# Patient Record
Sex: Female | Born: 1945 | Race: White | Hispanic: No | Marital: Married | Smoking: Never smoker
Health system: Southern US, Community
[De-identification: ages and names within clinical notes are randomized; demographics above are authoritative.]

## PROBLEM LIST (undated history)

## (undated) DIAGNOSIS — E119 Type 2 diabetes mellitus without complications: Secondary | ICD-10-CM

## (undated) DIAGNOSIS — M1990 Unspecified osteoarthritis, unspecified site: Secondary | ICD-10-CM

## (undated) HISTORY — PX: ABDOMINAL HYSTERECTOMY: SHX81

## (undated) HISTORY — PX: CHOLECYSTECTOMY: SHX55

---

## 2008-07-08 ENCOUNTER — Emergency Department (HOSPITAL_COMMUNITY): Admission: EM | Admit: 2008-07-08 | Discharge: 2008-07-08 | Payer: Self-pay | Admitting: Emergency Medicine

## 2010-10-08 LAB — BASIC METABOLIC PANEL
BUN: 23 mg/dL (ref 6–23)
Chloride: 100 mEq/L (ref 96–112)
Creatinine, Ser: 0.95 mg/dL (ref 0.4–1.2)
GFR calc non Af Amer: >60 mL/min (ref >60)

## 2010-10-08 LAB — GLUCOSE, CAPILLARY: Glucose-Capillary: 141 mg/dL — ABNORMAL HIGH (ref 70–99)

## 2013-05-12 DIAGNOSIS — H2510 Age-related nuclear cataract, unspecified eye: Secondary | ICD-10-CM | POA: Insufficient documentation

## 2013-07-30 DIAGNOSIS — Z947 Corneal transplant status: Secondary | ICD-10-CM | POA: Insufficient documentation

## 2013-07-30 DIAGNOSIS — Z961 Presence of intraocular lens: Secondary | ICD-10-CM | POA: Insufficient documentation

## 2014-02-25 DIAGNOSIS — R768 Other specified abnormal immunological findings in serum: Secondary | ICD-10-CM | POA: Insufficient documentation

## 2014-02-25 DIAGNOSIS — M1990 Unspecified osteoarthritis, unspecified site: Secondary | ICD-10-CM | POA: Insufficient documentation

## 2014-02-25 DIAGNOSIS — M064 Inflammatory polyarthropathy: Secondary | ICD-10-CM | POA: Insufficient documentation

## 2014-02-25 DIAGNOSIS — E6609 Other obesity due to excess calories: Secondary | ICD-10-CM | POA: Insufficient documentation

## 2014-03-14 DIAGNOSIS — L932 Other local lupus erythematosus: Secondary | ICD-10-CM | POA: Insufficient documentation

## 2014-03-14 DIAGNOSIS — T50905A Adverse effect of unspecified drugs, medicaments and biological substances, initial encounter: Secondary | ICD-10-CM | POA: Insufficient documentation

## 2014-09-19 DIAGNOSIS — M25561 Pain in right knee: Secondary | ICD-10-CM | POA: Insufficient documentation

## 2014-10-27 ENCOUNTER — Ambulatory Visit: Payer: Self-pay | Admitting: Orthopedic Surgery

## 2014-10-31 ENCOUNTER — Ambulatory Visit (INDEPENDENT_AMBULATORY_CARE_PROVIDER_SITE_OTHER): Payer: Medicare HMO | Admitting: Orthopedic Surgery

## 2014-10-31 ENCOUNTER — Ambulatory Visit (INDEPENDENT_AMBULATORY_CARE_PROVIDER_SITE_OTHER): Payer: Medicare HMO

## 2014-10-31 VITALS — BP 151/78 | Ht 64.0 in | Wt 314.0 lb

## 2014-10-31 DIAGNOSIS — M1711 Unilateral primary osteoarthritis, right knee: Secondary | ICD-10-CM

## 2014-10-31 DIAGNOSIS — M25561 Pain in right knee: Secondary | ICD-10-CM

## 2014-10-31 MED ORDER — TRAMADOL-ACETAMINOPHEN 37.5-325 MG PO TABS: 1.0000 | ORAL_TABLET | ORAL | Status: DC | PRN

## 2014-10-31 NOTE — Patient Instructions (Signed)
Total Knee Replacement °Total knee replacement is a procedure to replace your knee joint with an artificial knee joint (prosthetic knee joint). The purpose of this surgery is to reduce pain and improve your knee function. °LET YOUR HEALTH CARE PROVIDER KNOW ABOUT:  °· Any allergies you have. °· All medicines you are taking, including vitamins, herbs, eye drops, creams, and over-the-counter medicines. °· Previous problems you or members of your family have had with the use of anesthetics. °· Any blood disorders you have. °· Previous surgeries you have had. °· Medical conditions you have. °RISKS AND COMPLICATIONS  °Generally, total knee replacement is a safe procedure. However, problems can occur and include: °· Loss of range of motion of the knee or instability. °· Loosening of the prosthesis. °· Infection. °· Persistent pain. °BEFORE THE PROCEDURE  °· Do not eat or drink anything after midnight on the night before the procedure or as directed by your health care provider. °· Ask your health care provider about changing or stopping your regular medicines. This is especially important if you are taking diabetes medicines or blood thinners. °PROCEDURE  °· Just before the procedure, you will receive medicine that will make you drowsy (sedative). This will be given through a tube that is inserted into one of your veins (IV tube). °· Then you will be given one of the following: °¨ A medicine injected into your spine that numbs your body below the waist (spinal anesthetic). °¨ A medicine that makes you fall asleep (general anesthetic). °¨ You may also receive medicine to block feeling in your leg (nerve block) to help ease pain after surgery. °· An incision will be made in your knee. Your surgeon will take out any damaged cartilage and bone by sawing off the damaged surfaces. °· The surgeon will then put a new metal liner over the sawed-off portion of your thigh bone (femur) and a plastic liner over the sawed-off portion  of one of the bones of your lower leg (tibia). This is to restore alignment and function to your knee. A plastic piece is often used to restore the surface of your knee cap. °AFTER THE PROCEDURE  °· You will be taken to the recovery area. °· You may have drainage tubes to drain excess fluid from your knee. These tubes attach to a device that removes these fluids. °· Once you are awake, stable, and taking fluids well, you will be taken to your hospital room. °· You will receive physical therapy as prescribed by your health care provider. °· Your surgeon may recommend that you spend time (usually an additional 10-14 days) in an extended-care facility to help you begin walking again and improve your range of motion before you go home. °· You may also be prescribed blood-thinning medicine to decrease your risk of developing blood clots in your leg. °Document Released: 09/16/2000 Document Revised: 10/25/2013 Document Reviewed: 07/21/2011 °Exit[employer]® Patient Information ©2015 [employer]. This information is not intended to replace advice given to you by your health care provider. Make sure you discuss any questions you have with your health care provider. ° °

## 2014-10-31 NOTE — Progress Notes (Signed)
Patient ID: Katelyn Pope, female   DOB: 1945-10-25, 69 y.o.   MRN: [id number]  Chief Complaint  Patient presents with  . Knee Pain    right knee pain s/p fall, ref [facility]     Katelyn Pope is a 69 y.o. female.   HPI This is a 69 year old female who had some ongoing knee pain bilaterally but her situation change in January of this year when she fell and her symptoms got worse. She complains of throbbing burning stabbing pain medial joint line associated was swelling stiffness and giving way. She rates her pain 8 out of 10. She was seen in [place] and she got 2 injections and she is on meloxicam. She was told she was too heavy for knee replacement. Today her BMI is 53 although she lost 53 pounds last year  She has no allergies  Family history diabetes heart disease and cancer social history she does not smoke or drink she is currently retired from several middle jobs  Review of systems negative except for joint pain  Medical history diabetes hypertension arthritis  Surgery 1997 hysterectomy 2001 right carpal tunnel release and in 1998 had a cholecystectomy  Current medications are furosemide 40 moxifloxacin cam 7.5 omeprazole 20 Zyrtec 10 lisinopril 12.5 metformin 500 NovoLog 12 and Levemir 30 and she takes a statin drug as well. Review of Systems See above  No past medical history on file.  No past surgical history on file.  No family history on file.  Social History History  Substance Use Topics  . Smoking status: Not on file  . Smokeless tobacco: Not on file  . Alcohol Use: Not on file    No Known Allergies  Current Outpatient Prescriptions  Medication Sig Dispense Refill  . atorvastatin (LIPITOR) 10 MG tablet Take 10 mg by mouth daily.    . cetirizine (ZYRTEC) 10 MG tablet Take 10 mg by mouth daily.    . furosemide (LASIX) 40 MG tablet Take 40 mg by mouth.    . insulin aspart (NOVOLOG) 100 UNIT/ML injection Inject 12 Units into the skin.    Marland Kitchen. insulin  detemir (LEVEMIR) 100 UNIT/ML injection Inject 30 Units into the skin.    Marland Kitchen. lisinopril (PRINIVIL,ZESTRIL) 2.5 MG tablet Take 2.5 mg by mouth daily.    . meloxicam (MOBIC) 7.5 MG tablet Take 7.5 mg by mouth daily.    . metFORMIN (GLUCOPHAGE) 500 MG tablet Take by mouth 2 (two) times daily with a meal.    . omeprazole (PRILOSEC) 20 MG capsule Take 20 mg by mouth daily.    . traMADol-acetaminophen (ULTRACET) 37.5-325 MG per tablet Take 1 tablet by mouth every 4 (four) hours as needed. 90 tablet 5   No current facility-administered medications for this visit.       Physical Exam Blood pressure 151/78, height 5\' 4"  (1.626 m), weight 314 lb (142.429 kg). Physical Exam The patient is well developed well nourished and well groomed. Orientation to person place and time is normal  Mood is pleasant. Ambulatory status she is ambulatory now with no assistive devices she is limping favoring the right lower extremity. Her upper extremities checked out normal in terms of ability to use a walker in that she had no malalignment contracture subluxation atrophy tremor or weakness  Skin was intact and neurovascular exam is normal  She has medial joint line tenderness in both knees. Her range of motion is limited to 100. Both knees remain stable. Motor exam is normal. Her skin is  discolored from peripheral venous stasis disease. She has peripheral edema pitting. She has a 2+ dorsalis pedis pulse. And she has normal sensation in both feet.    Data Reviewed Our x-rays today show that she has severe arthritis on the medial compartment with greater than 50% loss of joint space she has secondary bone changes in the patellofemoral area and proximal tibia on the medial side  The records from the primary care physician R reviewed. Her primary care physician has documented basically what she is told me.  Assessment Osteoarthritis both knees are currently right worse than left with a BMI of 53. Although she did  lose 35 pounds last week we need to be even more aggressive to get her BMI to 40 for her chances of complication would go way down especially in the setting of diabetes. We know that a BMI greater than 40 shown to have significant complications with the wound healing and decreased long-term favorable results.   Plan She should continue her meloxicam she can take Ultracet for pain and wouldn't go anything higher than Tylenol 3 for pain however.  She will follow-up with us and I will send a letter to the primary care physician hopefully she can get some medication to help get her weight down. Follow-up 6 months.

## 2014-11-04 ENCOUNTER — Telehealth: Payer: Self-pay | Admitting: Orthopedic Surgery

## 2014-11-04 NOTE — Telephone Encounter (Signed)
Patient stopped by the office today stating that the medication she was given in the office on 10/31/14 traMADol-acetaminophen (ULTRACET) 37.5-325 MG per tablet is not helping at all, please advise?

## 2014-11-07 ENCOUNTER — Other Ambulatory Visit: Payer: Self-pay | Admitting: *Deleted

## 2014-11-07 ENCOUNTER — Ambulatory Visit: Payer: Self-pay | Admitting: Orthopedic Surgery

## 2014-11-07 MED ORDER — ACETAMINOPHEN-CODEINE #3 300-30 MG PO TABS: 1.0000 | ORAL_TABLET | ORAL | Status: DC | PRN

## 2014-11-07 NOTE — Telephone Encounter (Signed)
Per dr Romeo Appleharrison change to tylenol 3, patient aware, faxed to [facility]

## 2014-11-07 NOTE — Telephone Encounter (Signed)
Routing to Dr Harrison 

## 2014-11-07 NOTE — Telephone Encounter (Signed)
Plan She should continue her meloxicam she can take Ultracet for pain and wouldn't go anything higher than Tylenol 3 for pain however.  She will follow-up with us and I will send a letter to the primary care physician hopefully she can get some medication to help get her weight down. Follow-up 6 months.

## 2014-11-29 DIAGNOSIS — H1820 Unspecified corneal edema: Secondary | ICD-10-CM | POA: Insufficient documentation

## 2014-11-29 DIAGNOSIS — H25811 Combined forms of age-related cataract, right eye: Secondary | ICD-10-CM | POA: Insufficient documentation

## 2014-12-05 ENCOUNTER — Other Ambulatory Visit: Payer: Self-pay | Admitting: Orthopedic Surgery

## 2014-12-05 ENCOUNTER — Telehealth: Payer: Self-pay | Admitting: Orthopedic Surgery

## 2014-12-05 ENCOUNTER — Other Ambulatory Visit: Payer: Self-pay | Admitting: *Deleted

## 2014-12-05 MED ORDER — ACETAMINOPHEN-CODEINE #3 300-30 MG PO TABS: 1.0000 | ORAL_TABLET | ORAL | Status: DC | PRN

## 2014-12-05 MED ORDER — ACETAMINOPHEN-CODEINE #3 300-30 MG PO TABS: 1.0000 | ORAL_TABLET | Freq: Three times a day (TID) | ORAL | Status: DC | PRN

## 2014-12-05 NOTE — Telephone Encounter (Signed)
Prescription available, patient aware  

## 2014-12-05 NOTE — Telephone Encounter (Signed)
Patient is requesting a refill on acetaminophen-codeine (TYLENOL #3) 300-30 MG per tablet please advise?

## 2014-12-08 NOTE — Telephone Encounter (Signed)
Patient picked up Rx

## 2015-04-27 ENCOUNTER — Other Ambulatory Visit: Payer: Self-pay | Admitting: *Deleted

## 2015-04-27 DIAGNOSIS — M25561 Pain in right knee: Secondary | ICD-10-CM

## 2015-04-27 MED ORDER — TRAMADOL-ACETAMINOPHEN 37.5-325 MG PO TABS: 1.0000 | ORAL_TABLET | ORAL | Status: DC | PRN

## 2015-05-04 ENCOUNTER — Ambulatory Visit (INDEPENDENT_AMBULATORY_CARE_PROVIDER_SITE_OTHER): Payer: Medicare HMO | Admitting: Orthopedic Surgery

## 2015-05-04 VITALS — BP 162/84 | Ht 64.0 in | Wt 312.0 lb

## 2015-05-04 DIAGNOSIS — M1711 Unilateral primary osteoarthritis, right knee: Secondary | ICD-10-CM | POA: Diagnosis not present

## 2015-05-04 MED ORDER — ACETAMINOPHEN-CODEINE #3 300-30 MG PO TABS: 1.0000 | ORAL_TABLET | Freq: Three times a day (TID) | ORAL | Status: DC | PRN

## 2015-05-04 NOTE — Patient Instructions (Signed)
Continue to work on weight loss 

## 2015-05-04 NOTE — Progress Notes (Signed)
Chief Complaint  Patient presents with  . Follow-up    follow up right knee, discuss TKA    Diagnosis osteoarthritis right knee  69 year old female presents or presented with bilateral knee pain status post multiple injections, treatment with meloxicam no improvement. She was told she was too heavy for knee replacement I agreed acid of the some weight she lost 2 pounds we put her on Tylenol No. 3 and she got good pain relief  Review of systems no new findings  BP 162/84 mmHg  Ht 5\' 4"  (1.626 m)  Wt 312 lb (141.522 kg)  BMI 53.53 kg/m2 She's walking no support no limp today. Knee flexion is limited at 100. No tenderness or swelling knee stable motor exam intact skin shows pigmented changes from chronic vascular stasis she has some pitting edema as well in both legs  Impression osteoarthritis primarily right knee and left knee  Continue Tylenol No. 3 follow-up 6 months continue weight loss.     Meds ordered this encounter  Medications  . acetaminophen-codeine (TYLENOL #3) 300-30 MG tablet    Sig: Take 1 tablet by mouth every 8 (eight) hours as needed for moderate pain.    Dispense:  90 tablet    Refill:  5

## 2015-05-04 NOTE — Progress Notes (Signed)
Body mass index is 53.53 kg/(m^2). BP 162/84 mmHg  Ht 5\' 4"  (1.626 m)  Wt 312 lb (141.522 kg)  BMI 53.53 kg/m2

## 2015-06-01 ENCOUNTER — Telehealth: Payer: Self-pay | Admitting: Orthopedic Surgery

## 2015-06-01 NOTE — Telephone Encounter (Signed)
Patient called to relay that she was told by Dr Romeo AppleHarrison that she can call back to request stronger medication if needed for her right knee; had been prescribed: traMADol-acetaminophen (ULTRACET) 37.5-325 MG tablet [[id number]][[id number]] - please advise. Ph# [phone]

## 2015-06-01 NOTE — Telephone Encounter (Signed)
Tylenol 3 is as strong as she can have

## 2015-06-01 NOTE — Telephone Encounter (Signed)
Patient aware, says she already has refills on that medication

## 2015-06-01 NOTE — Telephone Encounter (Signed)
Routing to Dr Harrison for review 

## 2015-11-02 ENCOUNTER — Ambulatory Visit: Payer: Medicare HMO | Admitting: Orthopedic Surgery

## 2015-11-15 ENCOUNTER — Encounter: Payer: Self-pay | Admitting: Orthopedic Surgery

## 2015-11-15 ENCOUNTER — Ambulatory Visit: Payer: Medicare HMO | Admitting: Orthopedic Surgery

## 2015-11-27 ENCOUNTER — Other Ambulatory Visit: Payer: Self-pay | Admitting: *Deleted

## 2015-11-27 DIAGNOSIS — M25561 Pain in right knee: Secondary | ICD-10-CM

## 2015-11-27 MED ORDER — TRAMADOL-ACETAMINOPHEN 37.5-325 MG PO TABS: 1.0000 | ORAL_TABLET | ORAL | Status: DC | PRN

## 2016-03-21 DIAGNOSIS — G8929 Other chronic pain: Secondary | ICD-10-CM | POA: Insufficient documentation

## 2016-03-21 DIAGNOSIS — E1140 Type 2 diabetes mellitus with diabetic neuropathy, unspecified: Secondary | ICD-10-CM | POA: Insufficient documentation

## 2016-03-21 DIAGNOSIS — IMO0002 Reserved for concepts with insufficient information to code with codable children: Secondary | ICD-10-CM | POA: Insufficient documentation

## 2016-03-21 DIAGNOSIS — K219 Gastro-esophageal reflux disease without esophagitis: Secondary | ICD-10-CM | POA: Insufficient documentation

## 2016-06-26 ENCOUNTER — Other Ambulatory Visit: Payer: Self-pay | Admitting: Orthopedic Surgery

## 2016-06-26 DIAGNOSIS — M25561 Pain in right knee: Secondary | ICD-10-CM

## 2016-08-24 ENCOUNTER — Other Ambulatory Visit: Payer: Self-pay | Admitting: Orthopedic Surgery

## 2016-08-24 DIAGNOSIS — M25561 Pain in right knee: Secondary | ICD-10-CM

## 2016-09-30 ENCOUNTER — Encounter: Payer: Self-pay | Admitting: Orthopedic Surgery

## 2016-09-30 ENCOUNTER — Ambulatory Visit: Payer: Medicare HMO | Admitting: Orthopedic Surgery

## 2017-06-09 ENCOUNTER — Other Ambulatory Visit: Payer: Self-pay | Admitting: "Endocrinology

## 2017-08-06 ENCOUNTER — Encounter (HOSPITAL_COMMUNITY): Payer: Self-pay

## 2017-08-06 ENCOUNTER — Emergency Department (HOSPITAL_COMMUNITY): Payer: Medicare HMO

## 2017-08-06 ENCOUNTER — Other Ambulatory Visit: Payer: Self-pay

## 2017-08-06 ENCOUNTER — Emergency Department (HOSPITAL_COMMUNITY)
Admission: EM | Admit: 2017-08-06 | Discharge: 2017-08-06 | Disposition: A | Discharge status: Home / Self Care | Payer: Medicare HMO | Attending: Emergency Medicine | Admitting: Emergency Medicine

## 2017-08-06 DIAGNOSIS — S299XXA Unspecified injury of thorax, initial encounter: Secondary | ICD-10-CM | POA: Diagnosis present

## 2017-08-06 DIAGNOSIS — E1165 Type 2 diabetes mellitus with hyperglycemia: Secondary | ICD-10-CM | POA: Insufficient documentation

## 2017-08-06 DIAGNOSIS — Y998 Other external cause status: Secondary | ICD-10-CM | POA: Insufficient documentation

## 2017-08-06 DIAGNOSIS — S8012XA Contusion of left lower leg, initial encounter: Secondary | ICD-10-CM | POA: Insufficient documentation

## 2017-08-06 DIAGNOSIS — Z79899 Other long term (current) drug therapy: Secondary | ICD-10-CM | POA: Insufficient documentation

## 2017-08-06 DIAGNOSIS — Z9049 Acquired absence of other specified parts of digestive tract: Secondary | ICD-10-CM | POA: Insufficient documentation

## 2017-08-06 DIAGNOSIS — Y92410 Unspecified street and highway as the place of occurrence of the external cause: Secondary | ICD-10-CM | POA: Insufficient documentation

## 2017-08-06 DIAGNOSIS — S1093XA Contusion of unspecified part of neck, initial encounter: Secondary | ICD-10-CM | POA: Diagnosis not present

## 2017-08-06 DIAGNOSIS — T07XXXA Unspecified multiple injuries, initial encounter: Secondary | ICD-10-CM

## 2017-08-06 DIAGNOSIS — S301XXA Contusion of abdominal wall, initial encounter: Secondary | ICD-10-CM | POA: Diagnosis not present

## 2017-08-06 DIAGNOSIS — S8001XA Contusion of right knee, initial encounter: Secondary | ICD-10-CM | POA: Diagnosis not present

## 2017-08-06 DIAGNOSIS — S20211A Contusion of right front wall of thorax, initial encounter: Secondary | ICD-10-CM | POA: Insufficient documentation

## 2017-08-06 DIAGNOSIS — Y9389 Activity, other specified: Secondary | ICD-10-CM | POA: Insufficient documentation

## 2017-08-06 DIAGNOSIS — Z794 Long term (current) use of insulin: Secondary | ICD-10-CM | POA: Insufficient documentation

## 2017-08-06 HISTORY — DX: Type 2 diabetes mellitus without complications: E11.9

## 2017-08-06 HISTORY — DX: Unspecified osteoarthritis, unspecified site: M19.90

## 2017-08-06 LAB — URINALYSIS, ROUTINE W REFLEX MICROSCOPIC
Bacteria, UA: NONE SEEN
Bilirubin Urine: NEGATIVE
HGB URINE DIPSTICK: NEGATIVE
Ketones, ur: NEGATIVE mg/dL
Leukocytes, UA: NEGATIVE
Nitrite: NEGATIVE
Protein, ur: NEGATIVE mg/dL
SPECIFIC GRAVITY, URINE: 1.026 (ref 1.005–1.030)
pH: 5 (ref 5.0–8.0)

## 2017-08-06 LAB — COMPREHENSIVE METABOLIC PANEL
ALT: 46 U/L (ref 14–54)
AST: 59 U/L — AB (ref 15–41)
Albumin: 3.8 g/dL (ref 3.5–5.0)
Alkaline Phosphatase: 108 U/L (ref 38–126)
Anion gap: 10 (ref 5–15)
BUN: 19 mg/dL (ref 6–20)
CO2: 25 mmol/L (ref 22–32)
CREATININE: 1.09 mg/dL — AB (ref 0.44–1.00)
Calcium: 9.2 mg/dL (ref 8.9–10.3)
Chloride: 98 mmol/L — ABNORMAL LOW (ref 101–111)
GFR calc Af Amer: 59 mL/min — ABNORMAL LOW (ref >60)
GFR, EST NON AFRICAN AMERICAN: 51 mL/min — AB (ref >60)
Glucose, Bld: 430 mg/dL — ABNORMAL HIGH (ref 65–99)
POTASSIUM: 4 mmol/L (ref 3.5–5.1)
Sodium: 133 mmol/L — ABNORMAL LOW (ref 135–145)
TOTAL PROTEIN: 7 g/dL (ref 6.5–8.1)
Total Bilirubin: 0.8 mg/dL (ref 0.3–1.2)

## 2017-08-06 LAB — CBC WITH DIFFERENTIAL/PLATELET
BASOS ABS: 0 10*3/uL (ref 0.0–0.1)
Basophils Relative: 0 %
Eosinophils Absolute: 0.1 10*3/uL (ref 0.0–0.7)
Eosinophils Relative: 1 %
HEMATOCRIT: 38.3 % (ref 36.0–46.0)
Hemoglobin: 12.6 g/dL (ref 12.0–15.0)
LYMPHS PCT: 18 %
Lymphs Abs: 1.7 10*3/uL (ref 0.7–4.0)
MCH: 30.6 pg (ref 26.0–34.0)
MCHC: 32.9 g/dL (ref 30.0–36.0)
MCV: 93 fL (ref 78.0–100.0)
MONO ABS: 0.6 10*3/uL (ref 0.1–1.0)
Monocytes Relative: 6 %
Neutro Abs: 6.8 10*3/uL (ref 1.7–7.7)
Neutrophils Relative %: 75 %
Platelets: 203 10*3/uL (ref 150–400)
RBC: 4.12 MIL/uL (ref 3.87–5.11)
RDW: 12.8 % (ref 11.5–15.5)
WBC: 9.2 10*3/uL (ref 4.0–10.5)

## 2017-08-06 MED ORDER — FENTANYL CITRATE (PF) 100 MCG/2ML IJ SOLN
100.0000 ug | Freq: Once | INTRAMUSCULAR | Status: AC
  Administered 2017-08-06: 100 ug via INTRAVENOUS
  Filled 2017-08-06: qty 2

## 2017-08-06 NOTE — Discharge Instructions (Signed)
Tylenol every 4 hours as needed for pain.  Blood sugar today was 430.  Take your diabetic medication as soon as you arrive home tonight.  Call Dr. Joyce CopaNyland's office tomorrow to arrange to get seen this week.  We feel that it is important that you get reevaluated by Dr. Lysbeth GalasNyland in light of the fact that you refused CT scans tonight.  Return if you feel worse for any reason
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
## 2017-08-06 NOTE — ED Notes (Signed)
Patient transported to CT 
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
## 2017-09-17 DIAGNOSIS — E1159 Type 2 diabetes mellitus with other circulatory complications: Secondary | ICD-10-CM | POA: Insufficient documentation

## 2017-11-10 ENCOUNTER — Ambulatory Visit: Payer: Medicare HMO | Admitting: Orthopedic Surgery

## 2017-11-10 VITALS — BP 160/98 | HR 86 | Ht 64.0 in | Wt 287.0 lb

## 2017-11-10 DIAGNOSIS — M170 Bilateral primary osteoarthritis of knee: Secondary | ICD-10-CM

## 2017-11-10 MED ORDER — TRAMADOL-ACETAMINOPHEN 37.5-325 MG PO TABS: 1.0000 | ORAL_TABLET | ORAL | 0 refills | Status: DC | PRN

## 2017-11-10 NOTE — Progress Notes (Signed)
Progress Note//NEW PROBLEM  Patient ID: Katelyn Pope, female   DOB: Oct 09, 1945, 72 y.o.   MRN: [id number] Chief Complaint  Patient presents with  . Knee Pain    Knee pain, no injury.        Medical decision-making  Imaging:  Xrays ordered: y/n ?  Right knee x-ray  Encounter Diagnosis  Name Primary?  . Primary osteoarthritis of both knees Yes     PLAN:  At this point we will resume Ultracet for her as that worked in the past she needs a left knee x-ray on the next visit in 6 months she needs to work on her weight to get her BMI down to 40 and she needs her A1c down to under 7 Meds ordered this encounter  Medications  . traMADol-acetaminophen (ULTRACET) 37.5-325 MG tablet    Sig: Take 1 tablet by mouth every 4 (four) hours as needed.    Dispense:  42 tablet    Refill:  0      Chief Complaint  Patient presents with  . Knee Pain    Knee pain, no injury.    72 year old female followed by [facility] orthopedics for bilateral knee pain status post injections currently using salon pause pain patches and meloxicam for bilateral knee pain consistent with osteoarthritis.  She complains of dull aching bilateral knee pain primarily along the medial joint lines without any history of significant trauma.  Her symptoms seem to be associated with increased activity mild swelling stiffness and decreased range of motion  The meloxicam and injections she says did not help her pain at all    Review of Systems  Respiratory: Negative.   Cardiovascular: Negative.   Musculoskeletal: Positive for back pain.   Current Meds  Medication Sig  . ALPRAZolam (XANAX) 0.5 MG tablet Take 1 mg by mouth 3 (three) times daily.  Marland Kitchen atorvastatin (LIPITOR) 10 MG tablet Take 10 mg by mouth daily.  . cetirizine (ZYRTEC) 10 MG tablet Take 10 mg by mouth daily.  . furosemide (LASIX) 40 MG tablet Take 20 mg by mouth daily.   . insulin detemir (LEVEMIR) 100 UNIT/ML injection Inject 30 Units into the  skin every morning.   Marland Kitchen lisinopril (PRINIVIL,ZESTRIL) 2.5 MG tablet Take 2.5 mg by mouth daily.  . meloxicam (MOBIC) 7.5 MG tablet Take 7.5 mg by mouth daily.  . metFORMIN (GLUCOPHAGE) 500 MG tablet Take 500 mg by mouth at bedtime.   Marland Kitchen omeprazole (PRILOSEC) 20 MG capsule Take 20 mg by mouth daily.    Past Medical History:  Diagnosis Date  . Arthritis   . Diabetes mellitus without complication (HCC)      No Known Allergies  BP (!) 160/98   Pulse 86   Ht  (1.626 m)   Wt 287 lb (130.2 kg)   BMI 49.26 kg/m    Physical Exam  Constitutional: She is oriented to person, place, and time. She appears well-developed and well-nourished.  Moderate to severely overweight  Neurological: She is alert and oriented to person, place, and time.  Psychiatric: She has a normal mood and affect. Judgment normal.  Vitals reviewed.   Right Knee Exam   Muscle Strength  The patient has normal right knee strength.  Tenderness  The patient is experiencing tenderness in the medial joint line and pes anserinus.  Range of Motion  Extension: normal  Flexion:  120 normal   Tests  McMurray:  Medial - negative Lateral - negative Varus: negative Valgus: negative Drawer:  Anterior -  negative    Posterior - negative  Other  Erythema: absent Scars: absent Sensation: normal Pulse: present Swelling: none   Left Knee Exam   Muscle Strength  The patient has normal left knee strength.  Tenderness  The patient is experiencing tenderness in the medial joint line and pes anserinus.  Range of Motion  Extension: normal  Flexion:  120 normal   Tests  McMurray:  Medial - negative Lateral - negative Varus: negative Valgus: negative Drawer:  Anterior - negative     Posterior - negative  Other  Erythema: absent Scars: absent Sensation: normal Pulse: present Swelling: none     Fuller Canada, MD  12:24 PM 11/10/2017

## 2017-11-10 NOTE — Patient Instructions (Signed)
Weight loss   Start ultracet

## 2017-11-21 ENCOUNTER — Other Ambulatory Visit: Payer: Self-pay | Admitting: Orthopedic Surgery

## 2017-11-21 DIAGNOSIS — M170 Bilateral primary osteoarthritis of knee: Secondary | ICD-10-CM

## 2017-12-01 DIAGNOSIS — E113299 Type 2 diabetes mellitus with mild nonproliferative diabetic retinopathy without macular edema, unspecified eye: Secondary | ICD-10-CM | POA: Insufficient documentation

## 2017-12-01 DIAGNOSIS — H33002 Unspecified retinal detachment with retinal break, left eye: Secondary | ICD-10-CM | POA: Insufficient documentation

## 2017-12-01 DIAGNOSIS — H2511 Age-related nuclear cataract, right eye: Secondary | ICD-10-CM | POA: Insufficient documentation

## 2017-12-17 DIAGNOSIS — H33052 Total retinal detachment, left eye: Secondary | ICD-10-CM | POA: Insufficient documentation

## 2018-01-20 ENCOUNTER — Other Ambulatory Visit: Payer: Self-pay | Admitting: Orthopedic Surgery

## 2018-01-20 DIAGNOSIS — M170 Bilateral primary osteoarthritis of knee: Secondary | ICD-10-CM

## 2018-02-25 ENCOUNTER — Other Ambulatory Visit: Payer: Self-pay | Admitting: Orthopaedic Surgery

## 2018-02-25 DIAGNOSIS — M170 Bilateral primary osteoarthritis of knee: Secondary | ICD-10-CM

## 2018-04-06 ENCOUNTER — Other Ambulatory Visit: Payer: Self-pay | Admitting: Orthopedic Surgery

## 2018-04-06 DIAGNOSIS — M170 Bilateral primary osteoarthritis of knee: Secondary | ICD-10-CM

## 2018-05-06 ENCOUNTER — Other Ambulatory Visit: Payer: Self-pay | Admitting: Orthopedic Surgery

## 2018-05-06 DIAGNOSIS — M170 Bilateral primary osteoarthritis of knee: Secondary | ICD-10-CM

## 2018-05-13 ENCOUNTER — Encounter: Payer: Self-pay | Admitting: Orthopedic Surgery

## 2018-05-13 ENCOUNTER — Ambulatory Visit: Payer: 59 | Admitting: Orthopedic Surgery

## 2018-05-13 ENCOUNTER — Ambulatory Visit (INDEPENDENT_AMBULATORY_CARE_PROVIDER_SITE_OTHER): Payer: 59

## 2018-05-13 VITALS — BP 186/83 | HR 97 | Ht 64.0 in | Wt 286.0 lb

## 2018-05-13 DIAGNOSIS — M170 Bilateral primary osteoarthritis of knee: Secondary | ICD-10-CM

## 2018-05-13 DIAGNOSIS — G8929 Other chronic pain: Secondary | ICD-10-CM | POA: Diagnosis not present

## 2018-05-13 DIAGNOSIS — M25562 Pain in left knee: Secondary | ICD-10-CM

## 2018-05-13 DIAGNOSIS — Z6841 Body Mass Index (BMI) 40.0 and over, adult: Secondary | ICD-10-CM | POA: Diagnosis not present

## 2018-05-13 NOTE — Progress Notes (Addendum)
Progress Note   Patient ID: Katelyn StainBonnie J Weaver, female   DOB: 1945-09-06, 72 y.o.   MRN: [id number]   Chief Complaint  Patient presents with  . Knee Pain    Left knee getting worse    HPI The patient presents for evaluation of painful left knee  Location medial joint line Duration greater than a year Quality dull ache Severity moderate Associated with decreased range of motion currently on Ultracet and meloxicam  Review of Systems  Constitutional: Negative for fever.  Respiratory: Negative for shortness of breath.   Cardiovascular: Negative for chest pain.  Musculoskeletal: Positive for joint pain.  Neurological: Negative for tingling.   Current Meds  Medication Sig  . ALPRAZolam (XANAX) 0.5 MG tablet Take 1 mg by mouth 3 (three) times daily.  Marland Kitchen. atorvastatin (LIPITOR) 10 MG tablet Take 10 mg by mouth daily.  . cetirizine (ZYRTEC) 10 MG tablet Take 10 mg by mouth daily.  . furosemide (LASIX) 40 MG tablet Take 20 mg by mouth daily.   . insulin detemir (LEVEMIR) 100 UNIT/ML injection Inject 30 Units into the skin every morning.   Marland Kitchen. lisinopril (PRINIVIL,ZESTRIL) 2.5 MG tablet Take 2.5 mg by mouth daily.  . meloxicam (MOBIC) 7.5 MG tablet Take 7.5 mg by mouth daily.  . metFORMIN (GLUCOPHAGE) 500 MG tablet Take 500 mg by mouth at bedtime.   Marland Kitchen. omeprazole (PRILOSEC) 20 MG capsule Take 20 mg by mouth daily.  . traMADol-acetaminophen (ULTRACET) 37.5-325 MG tablet TAKE (1) TABLET EVERY FOUR HOURS AS NEEDED.    Past Medical History:  Diagnosis Date  . Arthritis   . Diabetes mellitus without complication (HCC)      No Known Allergies   BP (!) 186/83   Pulse 97   Ht 5\' 4"  (1.626 m)   Wt 286 lb (129.7 kg)   BMI 49.09 kg/m    Physical Exam General appearance normal Oriented x3 normal Mood pleasant affect normal Gait labored   Ortho Exam Left knee  She is noted to have a small flexion contracture her knee flexion arc is 110 degrees knee is stable to muscle tone is  normal she has tenderness in the medial compartment and the pes bursa Skin was warm dry and intact Good pulse and temperature were noted in the extremity Sensation revealed no abnormalities to light touch     MEDICAL DECISION MAKING   Imaging:  3 views left knee  5 degrees varus severe arthritis with joint space narrowing bone-on-bone contact severe secondary bone changes medial compartment   Encounter Diagnoses  Name Primary?  . Chronic pain of left knee   . Primary osteoarthritis of both knees Yes  . Morbid obesity (HCC)   . Body mass index 45.0-49.9, adult (HCC)      PLAN: (RX., injection, surgery,frx,mri/ct, XR 2 body ares) She did not want injections today she was advised to increase her Ultracet from 1 a day to 1 every 4-6 hours for pain which she was happy with she was advised to lose weight as well  The patient meets the AMA guidelines for Morbid (severe) obesity with a BMI > 40.0 and I have recommended weight loss.   No orders of the defined types were placed in this encounter.  11:43 AM 05/13/2018

## 2018-05-13 NOTE — Patient Instructions (Signed)
-   Weight loss 

## 2018-05-25 ENCOUNTER — Other Ambulatory Visit: Payer: Self-pay | Admitting: Orthopedic Surgery

## 2018-05-25 DIAGNOSIS — M170 Bilateral primary osteoarthritis of knee: Secondary | ICD-10-CM

## 2018-06-22 ENCOUNTER — Other Ambulatory Visit: Payer: Self-pay | Admitting: Orthopedic Surgery

## 2018-06-22 DIAGNOSIS — M170 Bilateral primary osteoarthritis of knee: Secondary | ICD-10-CM

## 2018-07-21 DIAGNOSIS — N183 Chronic kidney disease, stage 3 unspecified: Secondary | ICD-10-CM | POA: Insufficient documentation

## 2018-07-27 ENCOUNTER — Other Ambulatory Visit: Payer: Self-pay | Admitting: Orthopaedic Surgery

## 2018-07-27 DIAGNOSIS — M170 Bilateral primary osteoarthritis of knee: Secondary | ICD-10-CM

## 2018-08-24 ENCOUNTER — Other Ambulatory Visit: Payer: Self-pay | Admitting: Orthopaedic Surgery

## 2018-08-24 DIAGNOSIS — M170 Bilateral primary osteoarthritis of knee: Secondary | ICD-10-CM

## 2018-08-25 NOTE — Telephone Encounter (Signed)
(  Dr Harrison's patient) °

## 2018-09-24 ENCOUNTER — Other Ambulatory Visit: Payer: Self-pay | Admitting: Orthopedic Surgery

## 2018-09-24 DIAGNOSIS — M170 Bilateral primary osteoarthritis of knee: Secondary | ICD-10-CM

## 2018-11-11 ENCOUNTER — Encounter: Payer: Self-pay | Admitting: Orthopedic Surgery

## 2018-11-11 ENCOUNTER — Other Ambulatory Visit: Payer: Self-pay | Admitting: Orthopedic Surgery

## 2018-11-11 ENCOUNTER — Ambulatory Visit: Payer: Medicare HMO | Admitting: Orthopedic Surgery

## 2018-11-11 ENCOUNTER — Other Ambulatory Visit: Payer: Self-pay

## 2018-11-11 ENCOUNTER — Ambulatory Visit (INDEPENDENT_AMBULATORY_CARE_PROVIDER_SITE_OTHER): Payer: Medicare HMO

## 2018-11-11 VITALS — Temp 98.2°F | Resp 16 | Ht 64.0 in | Wt 281.0 lb

## 2018-11-11 DIAGNOSIS — M25562 Pain in left knee: Secondary | ICD-10-CM | POA: Diagnosis not present

## 2018-11-11 DIAGNOSIS — M170 Bilateral primary osteoarthritis of knee: Secondary | ICD-10-CM

## 2018-11-11 DIAGNOSIS — G8929 Other chronic pain: Secondary | ICD-10-CM

## 2018-11-11 DIAGNOSIS — Z6841 Body Mass Index (BMI) 40.0 and over, adult: Secondary | ICD-10-CM

## 2018-11-11 MED ORDER — TRAMADOL-ACETAMINOPHEN 37.5-325 MG PO TABS: 1.0000 | ORAL_TABLET | Freq: Four times a day (QID) | ORAL | 1 refills | Status: DC | PRN

## 2018-11-11 NOTE — Progress Notes (Signed)
  Progress Note   Patient ID: Katelyn Pope, female   DOB: June 29, 1945, 73 y.o.   MRN: [id number]   Chief Complaint  Patient presents with  . Knee Pain    left    73 yo female with chronic left knee pain from OA presents for her 6 mo fu.  Katelyn Pope is complaining of bilateral knee pain left greater than right with good relief using 2 Ultracet per day      Review of Systems  Musculoskeletal: Positive for joint pain. Negative for falls.  Neurological: Negative for tingling and sensory change.     No Known Allergies   Temp 98.2 F (36.8 C)   Resp 16   Ht 5\' 4"  (1.626 m)   Wt 281 lb (127.5 kg)   BMI 48.23 kg/m   Physical Exam Vitals signs and nursing note reviewed.  Constitutional:      Appearance: Normal appearance.  Musculoskeletal:       Legs:  Neurological:     Mental Status: Katelyn Pope is alert and oriented to person, place, and time.  Psychiatric:        Mood and Affect: Mood normal.      Medical decisions:   Data  Imaging:   See dictated report, x-ray shows moderate varus deformity severe arthritis medial compartment left knee  Encounter Diagnoses  Name Primary?  . Chronic pain of left knee Yes  . Primary osteoarthritis of both knees   . Body mass index 45.0-49.9, adult (HCC)   . Morbid obesity (HCC)     PLAN:   Refill tramadol with enough to last a month X-ray both knees in 3 months Return in 3 months    Fuller Canada, MD 11/11/2018 11:25 AM

## 2018-11-11 NOTE — Patient Instructions (Addendum)
Try to keep your weight down  Arthritis Arthritis is a term that is commonly used to refer to joint pain or joint disease. There are more than 100 types of arthritis. What are the causes? The most common cause of this condition is wear and tear of a joint. Other causes include:  Gout.  Inflammation of a joint.  An infection of a joint.  Sprains and other injuries near the joint.  A drug reaction or allergic reaction. In some cases, the cause may not be known. What are the signs or symptoms? The main symptom of this condition is pain in the joint with movement. Other symptoms include:  Redness, swelling, or stiffness at a joint.  Warmth coming from the joint.  Fever.  Overall feeling of illness. How is this diagnosed? This condition may be diagnosed with a physical exam and tests, including:  Blood tests.  Urine tests.  Imaging tests, such as MRI, X-rays, or a CT scan. Sometimes, fluid is removed from a joint for testing. How is this treated? Treatment for this condition may involve:  Treatment of the cause, if it is known.  Rest.  Raising (elevating) the joint.  Applying cold or hot packs to the joint.  Medicines to improve symptoms and reduce inflammation.  Injections of a steroid such as cortisone into the joint to help reduce pain and inflammation. Depending on the cause of your arthritis, you may need to make lifestyle changes to reduce stress on your joint. These changes may include exercising more and losing weight. Follow these instructions at home: Medicines  Take over-the-counter and prescription medicines only as told by your health care provider.  Do not take aspirin to relieve pain if gout is suspected. Activity  Rest your joint if told by your health care provider. Rest is important when your disease is active and your joint feels painful, swollen, or stiff.  Avoid activities that make the pain worse. It is important to balance activity with  rest.  Exercise your joint regularly with range-of-motion exercises as told by your health care provider. Try doing low-impact exercise, such as: ? Swimming. ? Water aerobics. ? Biking. ? Walking. Joint Care   If your joint is swollen, keep it elevated if told by your health care provider.  If your joint feels stiff in the morning, try taking a warm shower.  If directed, apply heat to the joint. If you have diabetes, do not apply heat without permission from your health care provider. ? Put a towel between the joint and the hot pack or heating pad. ? Leave the heat on the area for 20-30 minutes.  If directed, apply ice to the joint: ? Put ice in a plastic bag. ? Place a towel between your skin and the bag. ? Leave the ice on for 20 minutes, 2-3 times per day.  Keep all follow-up visits as told by your health care provider. This is important. Contact a health care provider if:  The pain gets worse.  You have a fever. Get help right away if:  You develop severe joint pain, swelling, or redness.  Many joints become painful and swollen.  You develop severe back pain.  You develop severe weakness in your leg.  You cannot control your bladder or bowels. This information is not intended to replace advice given to you by your health care provider. Make sure you discuss any questions you have with your health care provider. Document Released: 07/18/2004 Document Revised: 11/16/2015 Document Reviewed: 09/05/2014  Chartered certified accountant Patient Education  [employer].

## 2019-01-06 ENCOUNTER — Other Ambulatory Visit: Payer: Self-pay | Admitting: Orthopedic Surgery

## 2019-01-06 DIAGNOSIS — M170 Bilateral primary osteoarthritis of knee: Secondary | ICD-10-CM

## 2019-02-24 ENCOUNTER — Other Ambulatory Visit: Payer: Self-pay

## 2019-02-24 ENCOUNTER — Ambulatory Visit: Payer: Medicare HMO

## 2019-02-24 ENCOUNTER — Other Ambulatory Visit: Payer: Self-pay | Admitting: Orthopedic Surgery

## 2019-02-24 ENCOUNTER — Ambulatory Visit (INDEPENDENT_AMBULATORY_CARE_PROVIDER_SITE_OTHER): Payer: Medicare HMO | Admitting: Orthopedic Surgery

## 2019-02-24 VITALS — BP 136/79 | HR 84 | Ht 64.0 in | Wt 279.0 lb

## 2019-02-24 DIAGNOSIS — M170 Bilateral primary osteoarthritis of knee: Secondary | ICD-10-CM

## 2019-02-24 DIAGNOSIS — M1710 Unilateral primary osteoarthritis, unspecified knee: Secondary | ICD-10-CM

## 2019-02-24 DIAGNOSIS — Z6841 Body Mass Index (BMI) 40.0 and over, adult: Secondary | ICD-10-CM | POA: Diagnosis not present

## 2019-02-24 MED ORDER — TRAMADOL-ACETAMINOPHEN 37.5-325 MG PO TABS: 1.0000 | ORAL_TABLET | Freq: Four times a day (QID) | ORAL | 0 refills | Status: DC | PRN

## 2019-02-24 NOTE — Progress Notes (Signed)
  Progress Note   Patient ID: Katelyn Pope, female   DOB: 30-May-1946, 73 y.o.   MRN: [id number]   Chief Complaint  Patient presents with  . Knee Pain    Recheck on bilateral knees.    Encounter Diagnoses  Name Primary?  Marland Kitchen Arthritis of knee Yes  . Body mass index 45.0-49.9, adult ([place])   . Morbid obesity ([place])     73 years old bilateral knee pain bilateral knee arthritis BMI 47 currently on Ultracet using a topical lidocaine cream with salon Katelyn Pope presents here with a 53-month follow-up still complaining of severe pain loss of function.  She did get improved pain relief with 1 Ultracet twice a day but says she does have some excessive pain if she does a lot of activities such as shopping or housework    Review of Systems  Musculoskeletal: Positive for joint pain. Negative for falls.  Neurological: Negative for tingling and sensory change.      BP 136/79   Pulse 84   Ht 5\' 4"  (1.626 m)   Wt 279 lb (126.6 kg)   BMI 47.89 kg/m   Physical Exam Vitals signs and nursing note reviewed.  Constitutional:      Appearance: Normal appearance.  Neurological:     Mental Status: She is alert and oriented to person, place, and time.  Psychiatric:        Mood and Affect: Mood normal.    Right knee stiffness medial pain and tenderness no instability normal muscle tone  Left knee stiffness medial tenderness no instability normal muscle tone  Medical decisions:  (Established problem worse, x-ray ,physical therapy, over-the-counter medicines, read outside film or summarize x-ray)  Data  Imaging:   Bilateral knee x-rays 3 views each severe arthritis both knees moderate varus deformities   Encounter Diagnoses  Name Primary?  Marland Kitchen Arthritis of knee Yes  . Body mass index 45.0-49.9, adult ([place])   . Morbid obesity ([place])     PLAN:   Bilateral knee injections Continue Ultracet Continue topical medication Work on weight loss Fu in a yr   Meds ordered this encounter   Medications  . traMADol-acetaminophen (ULTRACET) 37.5-325 MG tablet    Sig: Take 1 tablet by mouth every 6 (six) hours as needed.    Dispense:  56 tablet    Refill:  0      Arther Abbott, MD 02/24/2019 11:19 AM

## 2019-04-12 ENCOUNTER — Other Ambulatory Visit: Payer: Self-pay | Admitting: Orthopedic Surgery

## 2019-04-12 DIAGNOSIS — M170 Bilateral primary osteoarthritis of knee: Secondary | ICD-10-CM
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
## 2019-05-26 ENCOUNTER — Ambulatory Visit: Payer: Medicare HMO | Admitting: Orthopedic Surgery

## 2019-06-07 ENCOUNTER — Ambulatory Visit: Payer: Medicare HMO | Admitting: Orthopedic Surgery

## 2019-06-22 ENCOUNTER — Other Ambulatory Visit: Payer: Self-pay | Admitting: Orthopedic Surgery

## 2019-06-22 DIAGNOSIS — M170 Bilateral primary osteoarthritis of knee: Secondary | ICD-10-CM

## 2019-07-09 ENCOUNTER — Other Ambulatory Visit: Payer: Self-pay | Admitting: Family

## 2019-07-09 ENCOUNTER — Other Ambulatory Visit (HOSPITAL_COMMUNITY): Payer: Self-pay | Admitting: Family

## 2019-07-09 DIAGNOSIS — R748 Abnormal levels of other serum enzymes: Secondary | ICD-10-CM

## 2019-07-12 ENCOUNTER — Ambulatory Visit: Payer: Medicare HMO | Admitting: Orthopedic Surgery

## 2019-07-15 ENCOUNTER — Encounter (HOSPITAL_COMMUNITY): Payer: Self-pay

## 2019-07-15 ENCOUNTER — Ambulatory Visit (HOSPITAL_COMMUNITY): Payer: Medicare Other

## 2019-07-23 ENCOUNTER — Encounter: Payer: Self-pay | Admitting: Orthopedic Surgery

## 2019-07-23 ENCOUNTER — Other Ambulatory Visit: Payer: Self-pay

## 2019-07-23 ENCOUNTER — Ambulatory Visit: Payer: Medicare Other | Admitting: Orthopedic Surgery

## 2019-07-23 VITALS — BP 153/66 | HR 82 | Ht 64.0 in | Wt 276.0 lb

## 2019-07-23 DIAGNOSIS — M1710 Unilateral primary osteoarthritis, unspecified knee: Secondary | ICD-10-CM

## 2019-07-23 DIAGNOSIS — M1711 Unilateral primary osteoarthritis, right knee: Secondary | ICD-10-CM

## 2019-07-23 DIAGNOSIS — M1712 Unilateral primary osteoarthritis, left knee: Secondary | ICD-10-CM

## 2019-07-23 MED ORDER — TRAMADOL-ACETAMINOPHEN 37.5-325 MG PO TABS: 1.0000 | ORAL_TABLET | Freq: Three times a day (TID) | ORAL | 5 refills | Status: DC | PRN

## 2019-07-23 NOTE — Progress Notes (Signed)
Chief Complaint  Patient presents with  . Knee Pain    bilateral     74 year old female with chronic knee pain diabetic obesity BMI greater than 40 has osteoarthritis of both knees currently on Ultracet 1 every 8 hours as needed stop taking meloxicam as it was not helping her pain presents for routine follow-up  Last x-rays September 2020  Says she does not want any injections  She does not do well with her activities of daily living even taking Ultracet.  She says the pharmacist told her that she can only take 2 a day because of something with her insurance  I am not familiar with that.  Her exam shows limited motion bilaterally in each knee she has medial lateral joint line tenderness with small effusions her quadricep strength remains normal bilaterally she walks with a noticeable limp  Encounter Diagnoses  Name Primary?  Marland Kitchen Arthritis of knee right knee Yes  . Primary localized osteoarthritis of knee left knee     Meds ordered this encounter  Medications  . traMADol-acetaminophen (ULTRACET) 37.5-325 MG tablet    Sig: Take 1 tablet by mouth every 8 (eight) hours as needed for up to 7 days.    Dispense:  21 tablet    Refill:  5   2 chronic problems not meeting goals of good pain relief and improved function  Follow-up in a year for x-ray

## 2019-07-23 NOTE — Patient Instructions (Signed)
Body we are writing you a new prescription for Ultracet 1 every 8 hours 21 tablets dispensed at a time to be picked up on a weekly basis as needed

## 2019-11-01 ENCOUNTER — Other Ambulatory Visit: Payer: Self-pay | Admitting: Orthopedic Surgery

## 2019-11-01 DIAGNOSIS — M1710 Unilateral primary osteoarthritis, unspecified knee: Secondary | ICD-10-CM

## 2019-11-11 ENCOUNTER — Other Ambulatory Visit: Payer: Self-pay | Admitting: Orthopedic Surgery

## 2019-11-11 DIAGNOSIS — M1710 Unilateral primary osteoarthritis, unspecified knee: Secondary | ICD-10-CM

## 2019-11-23 ENCOUNTER — Other Ambulatory Visit: Payer: Self-pay | Admitting: Orthopedic Surgery

## 2019-11-23 DIAGNOSIS — M1710 Unilateral primary osteoarthritis, unspecified knee: Secondary | ICD-10-CM

## 2019-12-02 ENCOUNTER — Other Ambulatory Visit: Payer: Self-pay | Admitting: Orthopedic Surgery

## 2019-12-02 DIAGNOSIS — M1710 Unilateral primary osteoarthritis, unspecified knee: Secondary | ICD-10-CM

## 2019-12-16 ENCOUNTER — Other Ambulatory Visit: Payer: Self-pay | Admitting: Orthopedic Surgery

## 2019-12-16 DIAGNOSIS — M1710 Unilateral primary osteoarthritis, unspecified knee: Secondary | ICD-10-CM

## 2019-12-28 ENCOUNTER — Other Ambulatory Visit: Payer: Self-pay | Admitting: Orthopedic Surgery

## 2019-12-28 DIAGNOSIS — M1710 Unilateral primary osteoarthritis, unspecified knee: Secondary | ICD-10-CM

## 2020-01-12 ENCOUNTER — Other Ambulatory Visit: Payer: Self-pay | Admitting: Orthopedic Surgery

## 2020-01-12 DIAGNOSIS — M1710 Unilateral primary osteoarthritis, unspecified knee: Secondary | ICD-10-CM

## 2020-01-24 ENCOUNTER — Other Ambulatory Visit: Payer: Self-pay | Admitting: Orthopedic Surgery

## 2020-01-24 DIAGNOSIS — M1710 Unilateral primary osteoarthritis, unspecified knee: Secondary | ICD-10-CM

## 2020-02-09 ENCOUNTER — Other Ambulatory Visit: Payer: Self-pay | Admitting: Orthopedic Surgery

## 2020-02-09 DIAGNOSIS — M1710 Unilateral primary osteoarthritis, unspecified knee: Secondary | ICD-10-CM

## 2020-02-21 ENCOUNTER — Other Ambulatory Visit: Payer: Self-pay | Admitting: Orthopedic Surgery

## 2020-02-21 DIAGNOSIS — M1710 Unilateral primary osteoarthritis, unspecified knee: Secondary | ICD-10-CM

## 2020-03-06 ENCOUNTER — Other Ambulatory Visit: Payer: Self-pay | Admitting: Orthopedic Surgery

## 2020-03-06 DIAGNOSIS — M1710 Unilateral primary osteoarthritis, unspecified knee: Secondary | ICD-10-CM

## 2020-03-24 ENCOUNTER — Other Ambulatory Visit: Payer: Self-pay | Admitting: Orthopedic Surgery

## 2020-03-24 DIAGNOSIS — M1710 Unilateral primary osteoarthritis, unspecified knee: Secondary | ICD-10-CM

## 2020-03-30 ENCOUNTER — Other Ambulatory Visit: Payer: Self-pay | Admitting: Orthopedic Surgery

## 2020-03-30 MED ORDER — TRAMADOL-ACETAMINOPHEN 37.5-325 MG PO TABS: 1.0000 | ORAL_TABLET | Freq: Four times a day (QID) | ORAL | 0 refills | Status: DC | PRN

## 2020-03-30 NOTE — Telephone Encounter (Signed)
Patient requests refill on Tramadol .37.5-325 mgs.  Qty  21       Sig: TAKE (1) TABLET EVERY EIGHT HOURS AS NEEDED.   Patient states she uses [facility]   I did tell her that normally our doctors do not do medication refills after 12:00 on Thursday.  She said she didn't know that and asks that I send it in for her anyway on the outside chance that he would do this.

## 2020-04-14 ENCOUNTER — Other Ambulatory Visit: Payer: Self-pay | Admitting: Orthopedic Surgery

## 2020-04-25 ENCOUNTER — Other Ambulatory Visit: Payer: Self-pay | Admitting: Orthopedic Surgery

## 2020-05-10 ENCOUNTER — Other Ambulatory Visit: Payer: Self-pay | Admitting: Orthopedic Surgery

## 2020-06-06 ENCOUNTER — Other Ambulatory Visit: Payer: Self-pay | Admitting: Orthopedic Surgery

## 2020-06-15 ENCOUNTER — Other Ambulatory Visit: Payer: Self-pay | Admitting: Orthopedic Surgery

## 2020-06-15 NOTE — Telephone Encounter (Signed)
Rx request 

## 2020-06-28 ENCOUNTER — Other Ambulatory Visit: Payer: Self-pay | Admitting: Orthopedic Surgery

## 2020-07-24 ENCOUNTER — Other Ambulatory Visit: Payer: Self-pay

## 2020-07-24 ENCOUNTER — Ambulatory Visit: Payer: Medicare HMO

## 2020-07-24 ENCOUNTER — Ambulatory Visit: Payer: Medicare HMO | Admitting: Orthopedic Surgery

## 2020-07-24 ENCOUNTER — Encounter: Payer: Self-pay | Admitting: Orthopedic Surgery

## 2020-07-24 VITALS — Ht 64.0 in | Wt 275.0 lb

## 2020-07-24 DIAGNOSIS — M1710 Unilateral primary osteoarthritis, unspecified knee: Secondary | ICD-10-CM

## 2020-07-24 DIAGNOSIS — M170 Bilateral primary osteoarthritis of knee: Secondary | ICD-10-CM | POA: Diagnosis not present

## 2020-07-24 DIAGNOSIS — Z6841 Body Mass Index (BMI) 40.0 and over, adult: Secondary | ICD-10-CM

## 2020-07-24 MED ORDER — TRAMADOL-ACETAMINOPHEN 37.5-325 MG PO TABS: ORAL_TABLET | ORAL | 0 refills | Status: DC

## 2020-07-24 NOTE — Progress Notes (Signed)
Chief Complaint  Patient presents with  . Follow-up    Recheck on both knees.    Encounter Diagnoses  Name Primary?  . Primary osteoarthritis of both knees Yes  . Body mass index 45.0-49.9, adult (HCC)   . Morbid obesity (HCC)   . Arthritis of knee     75 year old female with chronic bilateral knee pain currently on Ultracet  She is also noted to have a BMI of 47  Past Medical History: No date: Arthritis No date: Diabetes mellitus without complication (HCC)  Currently getting adequate pain control from the Ultracet.  She does use a cane to get around     Ht 5\' 4"  (1.626 m)   Wt 275 lb (124.7 kg)   BMI 47.20 kg/m  The patient meets the AMA guidelines for Morbid (severe) obesity with a BMI > 40.0 and I have recommended weight loss.  Physical Exam Vitals and nursing note reviewed.  Constitutional:      Appearance: Normal appearance.  Musculoskeletal:     Comments: Right and left knee exam Right knee Tenderness medial compartment 5-90 range of motion Ligaments tested and stable Muscle strength and tone normal  Left knee Mild tenderness medial compartment 5-1 10 range of motion ACL and PCL stable Muscle strength normal grade 5  Skin:    General: Skin is warm and dry.     Comments: Hyperemia distal tibias indicating chronic venous stasis disease but no edema  Neurological:     Mental Status: She is alert and oriented to person, place, and time.     Gait: Gait abnormal.  Psychiatric:        Mood and Affect: Mood normal.        Thought Content: Thought content normal.    Images of both knees were obtained.  Patient has moderate varus deformities severe narrowing of the medial compartment in both knees see report   Encounter Diagnoses  Name Primary?  . Primary osteoarthritis of both knees Yes  . Body mass index 45.0-49.9, adult (HCC)   . Morbid obesity (HCC)   . Arthritis of knee     Recommendations Weight loss Continue Ultracet Repeat x-rays in a  year  Meds ordered this encounter  Medications  . traMADol-acetaminophen (ULTRACET) 37.5-325 MG tablet    Sig: TAKE  (1)  TABLET  EVERY EIGHT HOURS AS NEEDED.    Dispense:  21 tablet    Refill:  0

## 2020-08-07 ENCOUNTER — Other Ambulatory Visit: Payer: Self-pay | Admitting: Orthopedic Surgery

## 2020-08-07 DIAGNOSIS — M1710 Unilateral primary osteoarthritis, unspecified knee: Secondary | ICD-10-CM

## 2020-08-18 ENCOUNTER — Other Ambulatory Visit: Payer: Self-pay | Admitting: Orthopedic Surgery

## 2020-08-18 DIAGNOSIS — M1710 Unilateral primary osteoarthritis, unspecified knee: Secondary | ICD-10-CM

## 2020-09-11 ENCOUNTER — Other Ambulatory Visit: Payer: Self-pay | Admitting: Orthopedic Surgery

## 2020-09-11 DIAGNOSIS — M1710 Unilateral primary osteoarthritis, unspecified knee: Secondary | ICD-10-CM

## 2020-10-12 ENCOUNTER — Other Ambulatory Visit: Payer: Self-pay | Admitting: Orthopedic Surgery

## 2020-10-12 DIAGNOSIS — M1710 Unilateral primary osteoarthritis, unspecified knee: Secondary | ICD-10-CM

## 2020-10-12 NOTE — Telephone Encounter (Signed)
Patient called request refill on medicine   traMADol-acetaminophen (ULTRACET) 37.5-325 MG tablet  Pharmacy:  [facility]

## 2020-11-08 ENCOUNTER — Other Ambulatory Visit: Payer: Self-pay | Admitting: Orthopedic Surgery

## 2020-11-08 DIAGNOSIS — M1710 Unilateral primary osteoarthritis, unspecified knee: Secondary | ICD-10-CM

## 2020-11-23 ENCOUNTER — Other Ambulatory Visit: Payer: Self-pay | Admitting: Orthopedic Surgery

## 2020-11-23 DIAGNOSIS — M1710 Unilateral primary osteoarthritis, unspecified knee: Secondary | ICD-10-CM

## 2020-12-23 ENCOUNTER — Other Ambulatory Visit: Payer: Self-pay | Admitting: Orthopedic Surgery

## 2020-12-23 DIAGNOSIS — M1710 Unilateral primary osteoarthritis, unspecified knee: Secondary | ICD-10-CM

## 2021-01-19 ENCOUNTER — Other Ambulatory Visit: Payer: Self-pay | Admitting: Orthopedic Surgery

## 2021-01-19 DIAGNOSIS — M1710 Unilateral primary osteoarthritis, unspecified knee: Secondary | ICD-10-CM

## 2021-02-06 ENCOUNTER — Other Ambulatory Visit: Payer: Self-pay | Admitting: Orthopedic Surgery

## 2021-02-06 DIAGNOSIS — M1710 Unilateral primary osteoarthritis, unspecified knee: Secondary | ICD-10-CM

## 2021-03-08 ENCOUNTER — Other Ambulatory Visit: Payer: Self-pay | Admitting: Orthopedic Surgery

## 2021-03-08 DIAGNOSIS — M1710 Unilateral primary osteoarthritis, unspecified knee: Secondary | ICD-10-CM

## 2021-04-06 ENCOUNTER — Other Ambulatory Visit: Payer: Self-pay | Admitting: Orthopedic Surgery

## 2021-04-06 DIAGNOSIS — M1710 Unilateral primary osteoarthritis, unspecified knee: Secondary | ICD-10-CM

## 2021-05-03 ENCOUNTER — Other Ambulatory Visit: Payer: Self-pay | Admitting: Orthopedic Surgery

## 2021-05-03 DIAGNOSIS — M1710 Unilateral primary osteoarthritis, unspecified knee: Secondary | ICD-10-CM

## 2021-05-30 ENCOUNTER — Other Ambulatory Visit: Payer: Self-pay | Admitting: Orthopedic Surgery

## 2021-05-30 DIAGNOSIS — M1710 Unilateral primary osteoarthritis, unspecified knee: Secondary | ICD-10-CM

## 2021-07-02 ENCOUNTER — Other Ambulatory Visit: Payer: Self-pay | Admitting: Orthopedic Surgery

## 2021-07-02 DIAGNOSIS — M1710 Unilateral primary osteoarthritis, unspecified knee: Secondary | ICD-10-CM

## 2021-07-25 ENCOUNTER — Ambulatory Visit: Payer: Medicare HMO | Admitting: Orthopedic Surgery

## 2021-07-27 ENCOUNTER — Other Ambulatory Visit: Payer: Self-pay | Admitting: Orthopedic Surgery

## 2021-07-27 DIAGNOSIS — M1710 Unilateral primary osteoarthritis, unspecified knee: Secondary | ICD-10-CM

## 2021-09-27 ENCOUNTER — Other Ambulatory Visit: Payer: Self-pay | Admitting: Orthopedic Surgery

## 2021-09-27 DIAGNOSIS — M1710 Unilateral primary osteoarthritis, unspecified knee: Secondary | ICD-10-CM

## 2021-11-05 ENCOUNTER — Other Ambulatory Visit: Payer: Self-pay | Admitting: Orthopedic Surgery

## 2021-11-05 DIAGNOSIS — M1710 Unilateral primary osteoarthritis, unspecified knee: Secondary | ICD-10-CM

## 2021-11-28 ENCOUNTER — Other Ambulatory Visit: Payer: Self-pay | Admitting: Orthopedic Surgery

## 2021-11-28 DIAGNOSIS — M1710 Unilateral primary osteoarthritis, unspecified knee: Secondary | ICD-10-CM

## 2021-12-24 ENCOUNTER — Other Ambulatory Visit: Payer: Self-pay | Admitting: Orthopedic Surgery

## 2021-12-24 DIAGNOSIS — M1710 Unilateral primary osteoarthritis, unspecified knee: Secondary | ICD-10-CM

## 2022-01-29 ENCOUNTER — Other Ambulatory Visit: Payer: Self-pay | Admitting: Orthopedic Surgery

## 2022-01-29 DIAGNOSIS — M1710 Unilateral primary osteoarthritis, unspecified knee: Secondary | ICD-10-CM

## 2022-02-19 ENCOUNTER — Other Ambulatory Visit: Payer: Self-pay | Admitting: Orthopedic Surgery

## 2022-02-19 DIAGNOSIS — M1710 Unilateral primary osteoarthritis, unspecified knee: Secondary | ICD-10-CM

## 2022-03-16 ENCOUNTER — Other Ambulatory Visit: Payer: Self-pay | Admitting: Orthopedic Surgery

## 2022-03-16 DIAGNOSIS — M1710 Unilateral primary osteoarthritis, unspecified knee: Secondary | ICD-10-CM

## 2022-04-22 ENCOUNTER — Other Ambulatory Visit: Payer: Self-pay | Admitting: Orthopedic Surgery

## 2022-04-22 DIAGNOSIS — M1710 Unilateral primary osteoarthritis, unspecified knee: Secondary | ICD-10-CM

## 2022-07-25 DEATH — deceased
